# Patient Record
Sex: Female | Born: 1995
Health system: Southern US, Community
[De-identification: ages and names within clinical notes are randomized; demographics above are authoritative.]

---

## 2020-02-03 ENCOUNTER — Ambulatory Visit (HOSPITAL_COMMUNITY): Admission: EM | Admit: 2020-02-03 | Discharge status: Absent without leave | Payer: Self-pay

## 2020-02-03 ENCOUNTER — Ambulatory Visit (INDEPENDENT_AMBULATORY_CARE_PROVIDER_SITE_OTHER): Payer: Self-pay

## 2020-02-03 ENCOUNTER — Encounter (HOSPITAL_COMMUNITY): Payer: Self-pay | Admitting: Gynecology

## 2020-02-03 ENCOUNTER — Ambulatory Visit (HOSPITAL_COMMUNITY)
Admission: EM | Admit: 2020-02-03 | Discharge: 2020-02-03 | Disposition: A | Discharge status: Home / Self Care | Payer: Self-pay | Attending: Family Medicine | Admitting: Family Medicine

## 2020-02-03 ENCOUNTER — Other Ambulatory Visit: Payer: Self-pay

## 2020-02-03 DIAGNOSIS — R1010 Upper abdominal pain, unspecified: Secondary | ICD-10-CM

## 2020-02-03 DIAGNOSIS — K5900 Constipation, unspecified: Secondary | ICD-10-CM

## 2020-02-03 DIAGNOSIS — Z3202 Encounter for pregnancy test, result negative: Secondary | ICD-10-CM

## 2020-02-03 DIAGNOSIS — R1011 Right upper quadrant pain: Secondary | ICD-10-CM

## 2020-02-03 LAB — POCT URINALYSIS DIPSTICK, ED / UC
Bilirubin Urine: NEGATIVE
Glucose, UA: NEGATIVE mg/dL
Hgb urine dipstick: NEGATIVE
Ketones, ur: NEGATIVE mg/dL
Leukocytes,Ua: NEGATIVE
Nitrite: NEGATIVE
Protein, ur: NEGATIVE mg/dL
Specific Gravity, Urine: 1.015 (ref 1.005–1.030)
Urobilinogen, UA: 0.2 mg/dL (ref 0.0–1.0)
pH: 7.5 (ref 5.0–8.0)

## 2020-02-03 LAB — POC URINE PREG, ED: Preg Test, Ur: NEGATIVE

## 2020-02-03 MED ORDER — POLYETHYLENE GLYCOL 3350 17 GM/SCOOP PO POWD: 1.0000 | Freq: Once | ORAL | 0 refills | Status: AC

## 2020-02-03 NOTE — ED Provider Notes (Signed)
[facility]    CSN: [id number] Arrival date & time: 02/03/20  1455      History   Chief Complaint Chief Complaint  Patient presents with   Abdominal Pain    HPI Shannon Fry is a 24 y.o. female.   Patient is a 24 year old female presents today with generalized abdominal discomfort more in upper abdominal area.  Has been pretty consistent in the right upper quadrant for the past day.  Took mag citrate without any relief.  Has had liquid stool.  Describes the pain is more with pressing.  Concern for gallbladder issue.  No nausea, vomiting, fevers.  No flank pain, dysuria, hematuria or urinary frequency.     History reviewed. No pertinent past medical history.  There are no problems to display for this patient.   History reviewed. No pertinent surgical history.  OB History   No obstetric history on file.      Home Medications    Prior to Admission medications   Medication Sig Start Date End Date Taking? Authorizing Provider  polyethylene glycol powder (GLYCOLAX/MIRALAX) 17 GM/SCOOP powder Take 255 g by mouth once for 1 dose. 02/03/20 02/03/20  Janace Aris, NP    Family History History reviewed. No pertinent family history.  Social History Social History   Tobacco Use   Smoking status: Never Smoker   Smokeless tobacco: Never Used  Substance Use Topics   Alcohol use: Yes    Comment: occassion   Drug use: Never     Allergies   Patient has no known allergies.   Review of Systems Review of Systems   Physical Exam Triage Vital Signs ED Triage Vitals  Enc Vitals Group     BP 02/03/20 1711 114/70     Pulse Rate 02/03/20 1711 91     Resp 02/03/20 1711 16     Temp 02/03/20 1711 98.7 F (37.1 C)     Temp src --      SpO2 02/03/20 1711 99 %     Weight 02/03/20 1714 165 lb (74.8 kg)     Height 02/03/20 1714 5\' 7"  (1.702 m)     Head Circumference --      Peak Flow --      Pain Score 02/03/20 1713 8     Pain Loc --      Pain Edu? --       Excl. in GC? --    No data found.  Updated Vital Signs BP 114/70 (BP Location: Left Arm)    Pulse 91    Temp 98.7 F (37.1 C)    Resp 16    Ht 5\' 7"  (1.702 m)    Wt 165 lb (74.8 kg)    LMP 01/13/2020    SpO2 99%    BMI 25.84 kg/m   Visual Acuity Right Eye Distance:   Left Eye Distance:   Bilateral Distance:    Right Eye Near:   Left Eye Near:    Bilateral Near:     Physical Exam Vitals and nursing note reviewed.  Constitutional:      General: She is not in acute distress.    Appearance: Normal appearance. She is not ill-appearing, toxic-appearing or diaphoretic.  HENT:     Head: Normocephalic.     Nose: Nose normal.     Mouth/Throat:     Pharynx: Oropharynx is clear.  Eyes:     Conjunctiva/sclera: Conjunctivae normal.  Pulmonary:     Effort: Pulmonary effort is normal.  Abdominal:     General: Abdomen is flat. Bowel sounds are decreased.     Palpations: Abdomen is soft. There is no hepatomegaly or splenomegaly.     Tenderness: There is no abdominal tenderness. There is no right CVA tenderness, left CVA tenderness or guarding.     Comments: Mild tenderness to the RUQ. Negative murphy's sign.   Musculoskeletal:        General: Normal range of motion.     Cervical back: Normal range of motion.  Skin:    General: Skin is warm and dry.     Findings: No rash.  Neurological:     Mental Status: She is alert.  Psychiatric:        Mood and Affect: Mood normal.      UC Treatments / Results  Labs (all labs ordered are listed, but only abnormal results are displayed) Labs Reviewed  POCT URINALYSIS DIPSTICK, ED / UC  POC URINE PREG, ED    EKG   Radiology DG Abd 1 View  Result Date: 02/03/2020 CLINICAL DATA:  Right upper quadrant pain for 4 days. Rule out constipation. EXAM: ABDOMEN - 1 VIEW COMPARISON:  None. FINDINGS: Normal bowel gas pattern. No evidence of obstruction. Small volume of stool in the right colon. No abnormal rectal distention. No radiopaque  calculi or abnormal soft tissue calcifications. No acute osseous abnormalities are seen. IMPRESSION: Normal bowel gas pattern. Small volume of colonic stool. No radiographic findings to suggest constipation or explanation for right upper quadrant pain. Electronically Signed   By: Narda Rutherford M.D.   On: 02/03/2020 18:57    Procedures Procedures (including critical care time)  Medications Ordered in UC Medications - No data to display  Initial Impression / Assessment and Plan / UC Course  I have reviewed the triage vital signs and the nursing notes.  Pertinent labs & imaging results that were available during my care of the patient were reviewed by me and considered in my medical decision making (see chart for details).     Abd pain, worse in the RUQ but nothing concerning on exam No acute abdomen.  She is not having any nausea, vomiting Could be gallbladder issue.  May be mildly constipated.  She can try MiraLAX to see if this helps.  If not she may need ultrasound of the abdomen No concern to go to the ER at this point.  Final Clinical Impressions(s) / UC Diagnoses   Final diagnoses:  Pain of upper abdomen  Constipation, unspecified constipation type     Discharge Instructions     Try doing miralax for constipation.  You can do this morning and evening until you get a good bowel movement.  If your pain worsens or you develop nausea, vomiting go to the ER Your urine did not show any infection.       ED Prescriptions    Medication Sig Dispense Auth. Provider   polyethylene glycol powder (GLYCOLAX/MIRALAX) 17 GM/SCOOP powder Take 255 g by mouth once for 1 dose. 255 g Dahlia Byes A, NP     PDMP not reviewed this encounter.   Janace Aris, NP 02/03/20 1922

## 2020-02-03 NOTE — ED Triage Notes (Signed)
Patient c/o abdominal pain x 4 days. Per patient RUQ pain x last night,

## 2020-02-03 NOTE — Discharge Instructions (Signed)
Try doing miralax for constipation.  You can do this morning and evening until you get a good bowel movement.  If your pain worsens or you develop nausea, vomiting go to the ER Your urine did not show any infection.

## 2022-04-27 IMAGING — DX DG ABDOMEN 1V
1 series · 1 of 1 positions shown · non-contrast
Comparison: None.

CLINICAL DATA: Right upper quadrant pain for 4 days. Rule out
constipation.

EXAM:
ABDOMEN - 1 VIEW

[abdomen kub]
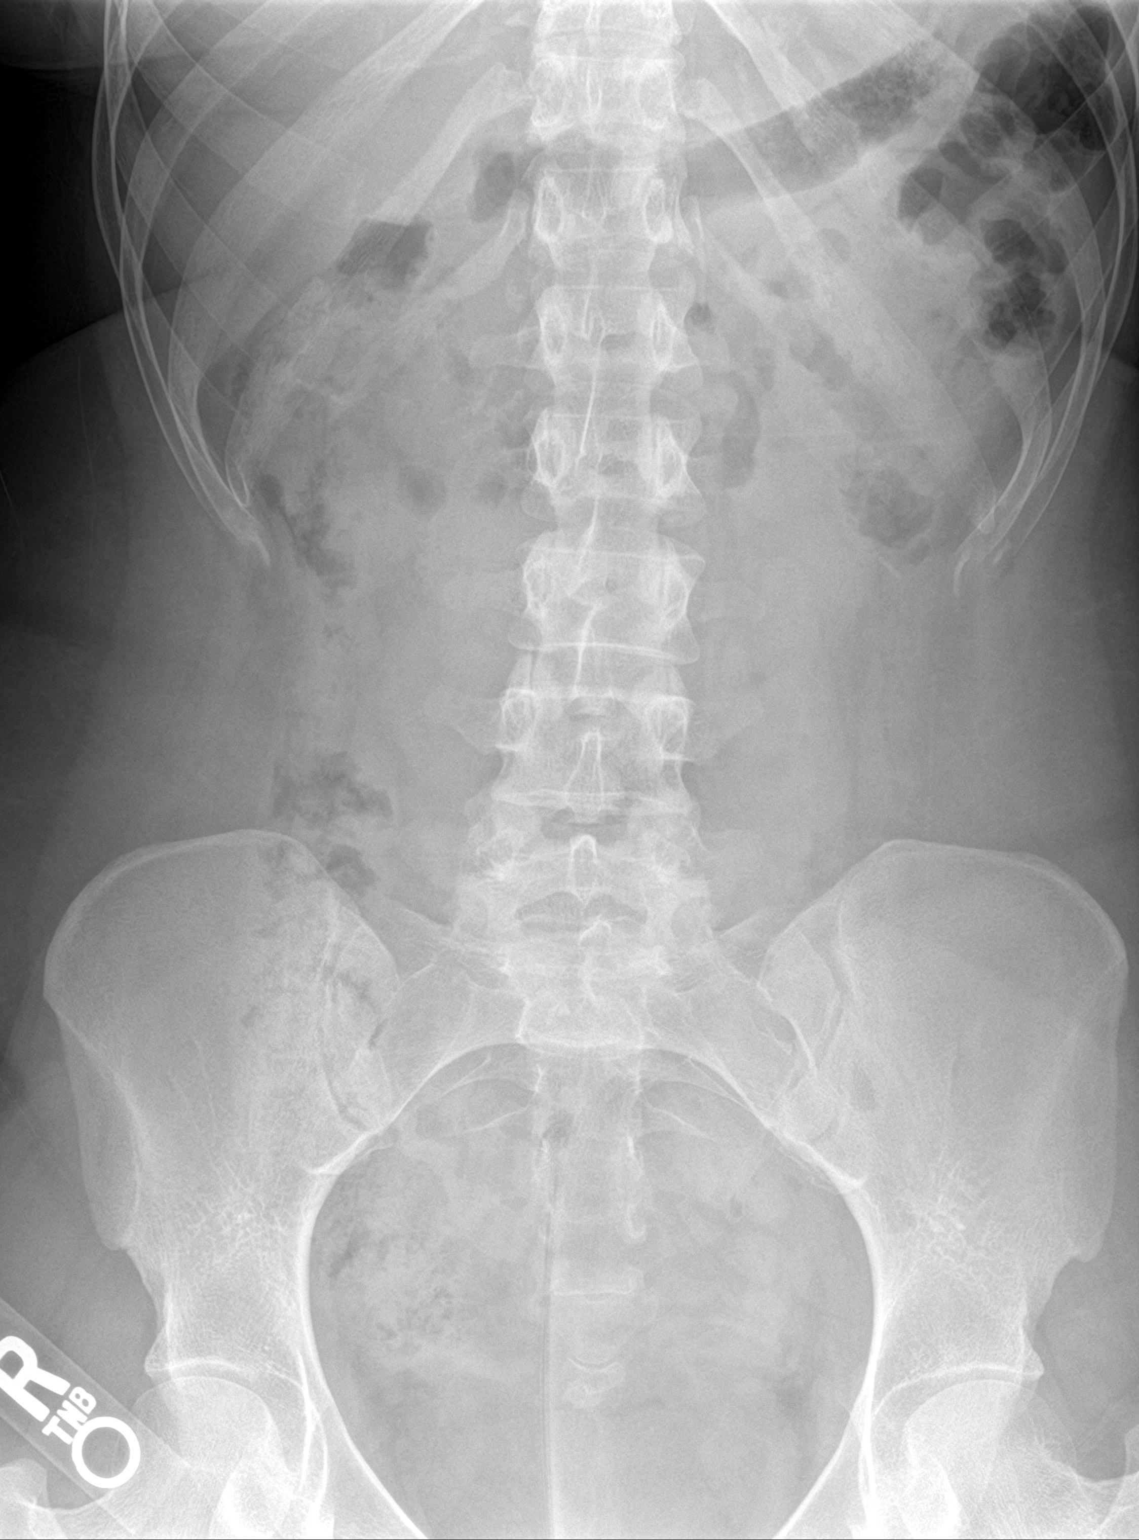

[1 of 1 positions shown; findings below may reference images not displayed]

FINDINGS: Normal bowel gas pattern. No evidence of obstruction. Small volume
of stool in the right colon. No abnormal rectal distention. No
radiopaque calculi or abnormal soft tissue calcifications. No acute
osseous abnormalities are seen.
IMPRESSION: Normal bowel gas pattern. Small volume of colonic stool. No
radiographic findings to suggest constipation or explanation for
right upper quadrant pain.
